# Patient Record
Sex: Male | Born: 1985 | Marital: Single | Smoking: Former smoker
Health system: Southern US, Community
[De-identification: ages and names within clinical notes are randomized; demographics above are authoritative.]

## PROBLEM LIST (undated history)

## (undated) ENCOUNTER — Emergency Department (HOSPITAL_COMMUNITY): Disposition: A | Discharge status: Home / Self Care | Payer: Self-pay

---

## 2013-12-04 ENCOUNTER — Emergency Department (HOSPITAL_COMMUNITY)
Admission: EM | Admit: 2013-12-04 | Discharge: 2013-12-04 | Disposition: A | Discharge status: Home / Self Care | Payer: BC Managed Care – PPO | Attending: Emergency Medicine | Admitting: Emergency Medicine

## 2013-12-04 ENCOUNTER — Encounter (HOSPITAL_COMMUNITY): Payer: Self-pay | Admitting: Emergency Medicine

## 2013-12-04 DIAGNOSIS — Z23 Encounter for immunization: Secondary | ICD-10-CM | POA: Insufficient documentation

## 2013-12-04 DIAGNOSIS — Z87891 Personal history of nicotine dependence: Secondary | ICD-10-CM | POA: Insufficient documentation

## 2013-12-04 DIAGNOSIS — IMO0002 Reserved for concepts with insufficient information to code with codable children: Secondary | ICD-10-CM | POA: Insufficient documentation

## 2013-12-04 DIAGNOSIS — S4980XA Other specified injuries of shoulder and upper arm, unspecified arm, initial encounter: Secondary | ICD-10-CM | POA: Insufficient documentation

## 2013-12-04 DIAGNOSIS — Y92410 Unspecified street and highway as the place of occurrence of the external cause: Secondary | ICD-10-CM | POA: Insufficient documentation

## 2013-12-04 DIAGNOSIS — Y9389 Activity, other specified: Secondary | ICD-10-CM | POA: Insufficient documentation

## 2013-12-04 DIAGNOSIS — S46909A Unspecified injury of unspecified muscle, fascia and tendon at shoulder and upper arm level, unspecified arm, initial encounter: Secondary | ICD-10-CM | POA: Insufficient documentation

## 2013-12-04 MED ORDER — TETANUS-DIPHTH-ACELL PERTUSSIS 5-2.5-18.5 LF-MCG/0.5 IM SUSP
0.5000 mL | Freq: Once | INTRAMUSCULAR | Status: AC
  Administered 2013-12-04: 0.5 mL via INTRAMUSCULAR
  Filled 2013-12-04: qty 0.5

## 2013-12-04 MED ORDER — METHOCARBAMOL 500 MG PO TABS: 500.0000 mg | ORAL_TABLET | Freq: Two times a day (BID) | ORAL | Status: AC

## 2013-12-04 MED ORDER — HYDROCODONE-ACETAMINOPHEN 5-325 MG PO TABS: 1.0000 | ORAL_TABLET | Freq: Four times a day (QID) | ORAL | Status: AC | PRN
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
## 2013-12-05 NOTE — ED Provider Notes (Signed)
Medical screening examination/treatment/procedure(s) were performed by non-physician practitioner and as supervising physician I was immediately available for consultation/collaboration.   EKG Interpretation None        Atavia Poppe M Lilyana Lippman, DO 12/05/13 1402 

## 2013-12-10 ENCOUNTER — Emergency Department (INDEPENDENT_AMBULATORY_CARE_PROVIDER_SITE_OTHER)
Admission: EM | Admit: 2013-12-10 | Discharge: 2013-12-10 | Disposition: A | Discharge status: Home / Self Care | Payer: BC Managed Care – PPO | Source: Home / Self Care | Attending: Emergency Medicine | Admitting: Emergency Medicine

## 2013-12-10 ENCOUNTER — Emergency Department (INDEPENDENT_AMBULATORY_CARE_PROVIDER_SITE_OTHER): Payer: BC Managed Care – PPO

## 2013-12-10 ENCOUNTER — Encounter (HOSPITAL_COMMUNITY): Payer: Self-pay | Admitting: Emergency Medicine

## 2013-12-10 DIAGNOSIS — Y92410 Unspecified street and highway as the place of occurrence of the external cause: Secondary | ICD-10-CM

## 2013-12-10 DIAGNOSIS — S2239XA Fracture of one rib, unspecified side, initial encounter for closed fracture: Secondary | ICD-10-CM

## 2013-12-10 DIAGNOSIS — S63509A Unspecified sprain of unspecified wrist, initial encounter: Secondary | ICD-10-CM

## 2013-12-10 MED ORDER — OXYCODONE-ACETAMINOPHEN 5-325 MG PO TABS: ORAL_TABLET | ORAL | Status: AC
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
## 2013-12-10 NOTE — Discharge Instructions (Signed)
Rib Fracture A rib fracture is a break or crack in one of the bones of the ribs. The ribs are a group of long, curved bones that wrap around your chest and attach to your spine. They protect your lungs and other organs in the chest cavity. A broken or cracked rib is often painful, but most do not cause other problems. Most rib fractures heal on their own over time. However, rib fractures can be more serious if multiple ribs are broken or if broken ribs move out of place and push against other structures. CAUSES   A direct blow to the chest. For example, this could happen during contact sports, a car accident, or a fall against a hard object.  Repetitive movements with high force, such as pitching a baseball or having severe coughing spells. SYMPTOMS   Pain when you breathe in or cough.  Pain when someone presses on the injured area. DIAGNOSIS  Your caregiver will perform a physical exam. Various imaging tests may be ordered to confirm the diagnosis and to look for related injuries. These tests may include a chest X-ray, computed tomography (CT), magnetic resonance imaging (MRI), or a bone scan. TREATMENT  Rib fractures usually heal on their own in 1 3 months. The longer healing period is often associated with a continued cough or other aggravating activities. During the healing period, pain control is very important. Medication is usually given to control pain. Hospitalization or surgery may be needed for more severe injuries, such as those in which multiple ribs are broken or the ribs have moved out of place.  HOME CARE INSTRUCTIONS   Avoid strenuous activity and any activities or movements that cause pain. Be careful during activities and avoid bumping the injured rib.  Gradually increase activity as directed by your caregiver.  Only take over-the-counter or prescription medications as directed by your caregiver. Do not take other medications without asking your caregiver first.  Apply ice  to the injured area for the first 1 2 days after you have been treated or as directed by your caregiver. Applying ice helps to reduce inflammation and pain.  Put ice in a plastic bag.  Place a towel between your skin and the bag.   Leave the ice on for 15 20 minutes at a time, every 2 hours while you are awake.  Perform deep breathing as directed by your caregiver. This will help prevent pneumonia, which is a common complication of a broken rib. Your caregiver may instruct you to:  Take deep breaths several times a day.  Try to cough several times a day, holding a pillow against the injured area.  Use a device called an incentive spirometer to practice deep breathing several times a day.  Drink enough fluids to keep your urine clear or pale yellow. This will help you avoid constipation.   Do not wear a rib belt or binder. These restrict breathing, which can lead to pneumonia.  SEEK IMMEDIATE MEDICAL CARE IF:   You have a fever.   You have difficulty breathing or shortness of breath.   You develop a continual cough, or you cough up thick or bloody sputum.  You feel sick to your stomach (nausea), throw up (vomit), or have abdominal pain.   You have worsening pain not controlled with medications.  MAKE SURE YOU:  Understand these instructions.  Will watch your condition.  Will get help right away if you are not doing well or get worse. Document Released: 07/21/2005 Document Revised:  03/23/2013 Document Reviewed: 09/22/2012 Exit[employer] Patient Information 2014 [place], [place].  Wrist Sprain with Rehab A sprain is an injury in which a ligament that maintains the proper alignment of a joint is partially or completely torn. The ligaments of the wrist are susceptible to sprains. Sprains are classified into three categories. Grade 1 sprains cause pain, but the tendon is not lengthened. Grade 2 sprains include a lengthened ligament because the ligament is stretched or partially  ruptured. With grade 2 sprains there is still function, although the function may be diminished. Grade 3 sprains are characterized by a complete tear of the tendon or muscle, and function is usually impaired. SYMPTOMS   Pain tenderness, inflammation, and/or bruising (contusion) of the injury.  A "pop" or tear felt and/or heard at the time of injury.  Decreased wrist function. CAUSES  A wrist sprain occurs when a force is placed on one or more ligaments that is greater than it/they can withstand. Common mechanisms of injury include:  Catching a ball with you hands.  Repetitive and/ or strenuous extension or flexion of the wrist. RISK INCREASES WITH:  Previous wrist injury.  Contact sports (boxing or wrestling).  Activities in which falling is common.  Poor strength and flexibility.  Improperly fitted or padded protective equipment. PREVENTION  Warm up and stretch properly before activity.  Allow for adequate recovery between workouts.  Maintain physical fitness:  Strength, flexibility, and endurance.  Cardiovascular fitness.  Protect the wrist joint by limiting its motion with the use of taping, braces, or splints.  Protect the wrist after injury for 6 to 12 months. PROGNOSIS  The prognosis for wrist sprains depends on the degree of injury. Grade 1 sprains require 2 to 6 weeks of treatment. Grade 2 sprains require 6 to 8 weeks of treatment, and grade 3 sprains require up to 12 weeks.  RELATED COMPLICATIONS   Prolonged healing time, if improperly treated or re-injured.  Recurrent symptoms that result in a chronic problem.  Injury to nearby structures (bone, cartilage, nerves, or tendons).  Arthritis of the wrist.  Inability to compete in athletics at a high level.  Wrist stiffness or weakness.  Progression to a complete rupture of the ligament. TREATMENT  Treatment initially involves resting from any activities that aggravate the symptoms, and the use of ice  and medications to help reduce pain and inflammation. Your caregiver may recommend immobilizing the wrist for a period of time in order to reduce stress on the ligament and allow for healing. After immobilization it is important to perform strengthening and stretching exercises to help regain strength and a full range of motion. These exercises may be completed at home or with a therapist. Surgery is not usually required for wrist sprains, unless the ligament has been ruptured (grade 3 sprain). MEDICATION   If pain medication is necessary, then nonsteroidal anti-inflammatory medications, such as aspirin and ibuprofen, or other minor pain relievers, such as acetaminophen, are often recommended.  Do not take pain medication for 7 days before surgery.  Prescription pain relievers may be given if deemed necessary by your caregiver. Use only as directed and only as much as you need. HEAT AND COLD  Cold treatment (icing) relieves pain and reduces inflammation. Cold treatment should be applied for 10 to 15 minutes every 2 to 3 hours for inflammation and pain and immediately after any activity that aggravates your symptoms. Use ice packs or massage the area with a piece of ice (ice massage).  Heat treatment may be  used prior to performing the stretching and strengthening activities prescribed by your caregiver, physical therapist, or athletic trainer. Use a heat pack or soak your injury in warm water. SEEK MEDICAL CARE IF:  Treatment seems to offer no benefit, or the condition worsens.  Any medications produce adverse side effects. EXERCISES RANGE OF MOTION (ROM) AND STRETCHING EXERCISES - Wrist Sprain  These exercises may help you when beginning to rehabilitate your injury. Your symptoms may resolve with or without further involvement from your physician, physical therapist or athletic trainer. While completing these exercises, remember:   Restoring tissue flexibility helps normal motion to return to  the joints. This allows healthier, less painful movement and activity.  An effective stretch should be held for at least 30 seconds.  A stretch should never be painful. You should only feel a gentle lengthening or release in the stretched tissue. RANGE OF MOTION  Wrist Flexion, Active-Assisted  Extend your right / left elbow with your fingers pointing down.*  Gently pull the back of your hand towards you until you feel a gentle stretch on the top of your forearm.  Hold this position for __________ seconds. Repeat __________ times. Complete this exercise __________ times per day.  *If directed by your physician, physical therapist or athletic trainer, complete this stretch with your elbow bent rather than extended. RANGE OF MOTION  Wrist Extension, Active-Assisted  Extend your right / left elbow and turn your palm upwards.*  Gently pull your palm/fingertips back so your wrist extends and your fingers point more toward the ground.  You should feel a gentle stretch on the inside of your forearm.  Hold this position for __________ seconds. Repeat __________ times. Complete this exercise __________ times per day. *If directed by your physician, physical therapist or athletic trainer, complete this stretch with your elbow bent, rather than extended. RANGE OF MOTION  Supination, Active  Stand or sit with your elbows at your side. Bend your right / left elbow to 90 degrees.  Turn your palm upward until you feel a gentle stretch on the inside of your forearm.  Hold this position for __________ seconds. Slowly release and return to the starting position. Repeat __________ times. Complete this stretch __________ times per day.  RANGE OF MOTION  Pronation, Active  Stand or sit with your elbows at your side. Bend your right / left elbow to 90 degrees.  Turn your palm downward until you feel a gentle stretch on the top of your forearm.  Hold this position for __________ seconds. Slowly  release and return to the starting position. Repeat __________ times. Complete this stretch __________ times per day.  STRETCH - Wrist Flexion  Place the back of your right / left hand on a tabletop leaving your elbow slightly bent. Your fingers should point away from your body.  Gently press the back of your hand down onto the table by straightening your elbow. You should feel a stretch on the top of your forearm.  Hold this position for __________ seconds. Repeat __________ times. Complete this stretch __________ times per day.  STRETCH  Wrist Extension  Place your right / left fingertips on a tabletop leaving your elbow slightly bent. Your fingers should point backwards.  Gently press your fingers and palm down onto the table by straightening your elbow. You should feel a stretch on the inside of your forearm.  Hold this position for __________ seconds. Repeat __________ times. Complete this stretch __________ times per day.  STRENGTHENING EXERCISES - Wrist  Sprain These exercises may help you when beginning to rehabilitate your injury. They may resolve your symptoms with or without further involvement from your physician, physical therapist or athletic trainer. While completing these exercises, remember:   Muscles can gain both the endurance and the strength needed for everyday activities through controlled exercises.  Complete these exercises as instructed by your physician, physical therapist or athletic trainer. Progress with the resistance and repetition exercises only as your caregiver advises. STRENGTH Wrist Flexors  Sit with your right / left forearm palm-up and fully supported. Your elbow should be resting below the height of your shoulder. Allow your wrist to extend over the edge of the surface.  Loosely holding a __________ weight or a piece of rubber exercise band/tubing, slowly curl your hand up toward your forearm.  Hold this position for __________ seconds. Slowly lower  the wrist back to the starting position in a controlled manner. Repeat __________ times. Complete this exercise __________ times per day.  STRENGTH  Wrist Extensors  Sit with your right / left forearm palm-down and fully supported. Your elbow should be resting below the height of your shoulder. Allow your wrist to extend over the edge of the surface.  Loosely holding a __________ weight or a piece of rubber exercise band/tubing, slowly curl your hand up toward your forearm.  Hold this position for __________ seconds. Slowly lower the wrist back to the starting position in a controlled manner. Repeat __________ times. Complete this exercise __________ times per day.  STRENGTH - Ulnar Deviators  Stand with a ____________________ weight in your right / left hand, or sit holding on to the rubber exercise band/tubing with your opposite arm supported.  Move your wrist so that your pinkie travels toward your forearm and your thumb moves away from your forearm.  Hold this position for __________ seconds and then slowly lower the wrist back to the starting position. Repeat __________ times. Complete this exercise __________ times per day STRENGTH - Radial Deviators  Stand with a ____________________ weight in your  right / left hand, or sit holding on to the rubber exercise band/tubing with your arm supported.  Raise your hand upward in front of you or pull up on the rubber tubing.  Hold this position for __________ seconds and then slowly lower the wrist back to the starting position. Repeat __________ times. Complete this exercise __________ times per day. STRENGTH  Forearm Supinators  Sit with your right / left forearm supported on a table, keeping your elbow below shoulder height. Rest your hand over the edge, palm down.  Gently grip a hammer or a soup ladle.  Without moving your elbow, slowly turn your palm and hand upward to a "thumbs-up" position.  Hold this position for __________  seconds. Slowly return to the starting position. Repeat __________ times. Complete this exercise __________ times per day.  STRENGTH  Forearm Pronators  Sit with your right / left forearm supported on a table, keeping your elbow below shoulder height. Rest your hand over the edge, palm up.  Gently grip a hammer or a soup ladle.  Without moving your elbow, slowly turn your palm and hand upward to a "thumbs-up" position.  Hold this position for __________ seconds. Slowly return to the starting position. Repeat __________ times. Complete this exercise __________ times per day.  STRENGTH - Grip  Grasp a tennis ball, a dense sponge, or a large, rolled sock in your hand.  Squeeze as hard as you can without increasing any pain.  Hold this position for __________ seconds. Release your grip slowly. Repeat __________ times. Complete this exercise __________ times per day.  Document Released: 07/21/2005 Document Revised: 10/13/2011 Document Reviewed: 11/02/2008 [facility] Patient Information 2014 [employer], [place].

## 2013-12-10 NOTE — ED Notes (Signed)
Pt  Reports  He  Was  Was    [facility]  [facility]  [facility]  [facility]     [facility]           He  States  Was  Not  X  Rayed               He  Reports  He  Continues  To  Have  Pain        r  Wist  And  Pain   r  Ribcage

## 2013-12-26 ENCOUNTER — Emergency Department (INDEPENDENT_AMBULATORY_CARE_PROVIDER_SITE_OTHER)
Admission: EM | Admit: 2013-12-26 | Discharge: 2013-12-26 | Disposition: A | Discharge status: Home / Self Care | Payer: BC Managed Care – PPO | Source: Home / Self Care | Attending: Family Medicine | Admitting: Family Medicine

## 2013-12-26 ENCOUNTER — Encounter (HOSPITAL_COMMUNITY): Payer: Self-pay | Admitting: Emergency Medicine

## 2013-12-26 DIAGNOSIS — R0781 Pleurodynia: Secondary | ICD-10-CM

## 2013-12-26 DIAGNOSIS — R079 Chest pain, unspecified: Secondary | ICD-10-CM
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
## 2013-12-26 NOTE — Discharge Instructions (Signed)
Thank you for coming in today. °Come back as needed °

## 2013-12-26 NOTE — ED Notes (Signed)
Pt here for note to return to work.  Pt voices no concerns at this time.

## 2014-09-07 ENCOUNTER — Emergency Department (HOSPITAL_COMMUNITY)
Admission: EM | Admit: 2014-09-07 | Discharge: 2014-09-07 | Disposition: A | Discharge status: Home / Self Care | Payer: BLUE CROSS/BLUE SHIELD | Source: Home / Self Care | Attending: Emergency Medicine | Admitting: Emergency Medicine

## 2014-09-07 ENCOUNTER — Encounter (HOSPITAL_COMMUNITY): Payer: Self-pay | Admitting: Emergency Medicine

## 2014-09-07 DIAGNOSIS — M779 Enthesopathy, unspecified: Secondary | ICD-10-CM

## 2014-09-07 LAB — D-DIMER, QUANTITATIVE: D-Dimer, Quant: <0.27 ug/mL-FEU (ref 0.00–0.48)

## 2014-09-07 MED ORDER — DICLOFENAC SODIUM 75 MG PO TBEC: 75.0000 mg | DELAYED_RELEASE_TABLET | Freq: Two times a day (BID) | ORAL | Status: AC

## 2014-09-07 NOTE — ED Notes (Signed)
Pt  Reports  r   Lower      Leg       Pain  And  Swelling          Since  Yesterday          Pt  Reports  A  Cramping   Sensation        As  Well      He  denys  A  specefic  Injury       He  Is awake  And  Alert  And  Oriented        Speaking  In  Complete   sentances  And  Is  In no  Severe  Distress    denys  Any  Chest  Pain or  Any  Shortness  Of  Breath

## 2014-09-07 NOTE — ED Provider Notes (Signed)
   Chief Complaint   Leg Pain   History of Present Illness   Jeffery Mayo is a 29 year old male who has had a two-day history of right medial ankle pain. This began with a cramp. He noted a swollen area just behind his medial malleolus. This was tender to touch. It hurt to bear weight and to move the ankle. He denies any injury. There is no calf tenderness. No pain in the foot or swelling. He denies chest pain or shortness of breath. No prior history of DVT or thrombophlebitis. Patient states he is on his feet a lot in his work and does a lot of walking.  Review of Systems   Other than as noted above, the patient denies any of the following symptoms: Systemic:  No fevers or chills.   Musculoskeletal:  No joint pain or swelling, back pain, or neck pain. Neurological:  No muscular weakness or paresthesias.  PMFSH   Past medical history, family history, social history, meds, and allergies were reviewed.   Physical Examination     Vital signs:  BP 114/61 mmHg  Pulse 65  Temp(Src) 98.1 F (36.7 C) (Oral)  Resp 15  SpO2 97% Gen:  Alert and oriented times 3.  In no distress. Musculoskeletal: Exam of the ankle reveals there was some swelling and erythema just posterior to the right medial malleolus. No palpable cord, no calf tenderness, Homans sign was negative. Anterior drawer sign negative.  Talar tilt negative. Squeeze test negative. Achilles tendon, peroneal tendon, and tibialis posterior were intact. Thompson's squeeze test was normal, revealing normal Achilles tendon function. Otherwise, all joints had a full a ROM with no swelling, bruising or deformity.  No edema, pulses full. Extremities were warm and pink.  Capillary refill was brisk.  Skin:  Clear, warm and dry.  No rash. Neuro:  Alert and oriented times 3.  Muscle strength was normal.  Sensation was intact to light touch.    Labs   Results for orders placed or performed during the hospital encounter of 09/07/14  D-dimer,  quantitative  Result Value Ref Range   D-Dimer, Quant <0.27 0.00 - 0.48 ug/mL-FEU    Course in Urgent Care Center   He was placed in an ASO brace.  Assessment   The encounter diagnosis was Tendonitis.  Normal d-dimer test and low risk on Wells score make thrombophlebitis very unlikely.   Plan     1.  Meds:  The following meds were prescribed:   Discharge Medication List as of 09/07/2014 12:51 PM    START taking these medications   Details  diclofenac (VOLTAREN) 75 MG EC tablet Take 1 tablet (75 mg total) by mouth 2 (two) times daily., Starting 09/07/2014, Until Discontinued, Normal        2.  Patient Education/Counseling:  The patient was given appropriate handouts, self care instructions, including rest and activity, elevation, application of ice and compression, and instructed in pain control.    3.  Follow up:  The patient was told to follow up here if no better in 3 to 4 days, or sooner if becoming worse in any way, and given some red flag symptoms such as increasing pain or neurological symptoms which would prompt immediate return.  Follow up if no better in 2 weeks.     Reuben Likesavid C Spyros Winch, MD 09/07/14 2106

## 2014-09-15 ENCOUNTER — Telehealth (HOSPITAL_COMMUNITY): Payer: Self-pay | Admitting: *Deleted

## 2014-09-15 NOTE — ED Notes (Signed)
Pt. called on VM x 2 asking for form to be filled out for him to go back to work. He said he saw Dr. Lorenz CoasterKeller.  I called pt. back and told him Dr. Lorenz CoasterKeller was out of town this past week.  He said he came back and someone else filled it out. It was done on Monday. Jeffery Mayo, Jeffery Mayo 09/15/2014
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
